# Patient Record
Sex: Female | Born: 1940 | Race: White | Hispanic: No | Marital: Married | Smoking: Never smoker
Health system: Southern US, Community
[De-identification: ages and names within clinical notes are randomized; demographics above are authoritative.]

## PROBLEM LIST (undated history)

## (undated) DIAGNOSIS — F32A Depression, unspecified: Secondary | ICD-10-CM

## (undated) DIAGNOSIS — N390 Urinary tract infection, site not specified: Secondary | ICD-10-CM

## (undated) DIAGNOSIS — E039 Hypothyroidism, unspecified: Secondary | ICD-10-CM

## (undated) DIAGNOSIS — F329 Major depressive disorder, single episode, unspecified: Secondary | ICD-10-CM

## (undated) DIAGNOSIS — R51 Headache: Secondary | ICD-10-CM

## (undated) DIAGNOSIS — R110 Nausea: Secondary | ICD-10-CM

## (undated) DIAGNOSIS — R42 Dizziness and giddiness: Secondary | ICD-10-CM

## (undated) DIAGNOSIS — K635 Polyp of colon: Secondary | ICD-10-CM

## (undated) DIAGNOSIS — M797 Fibromyalgia: Secondary | ICD-10-CM

## (undated) DIAGNOSIS — G8929 Other chronic pain: Secondary | ICD-10-CM

## (undated) DIAGNOSIS — R5383 Other fatigue: Secondary | ICD-10-CM

## (undated) DIAGNOSIS — K589 Irritable bowel syndrome without diarrhea: Secondary | ICD-10-CM

## (undated) DIAGNOSIS — E785 Hyperlipidemia, unspecified: Secondary | ICD-10-CM

## (undated) DIAGNOSIS — R55 Syncope and collapse: Secondary | ICD-10-CM

## (undated) HISTORY — DX: Hypothyroidism, unspecified: E03.9

## (undated) HISTORY — DX: Fibromyalgia: M79.7

## (undated) HISTORY — DX: Depression, unspecified: F32.A

## (undated) HISTORY — DX: Dizziness and giddiness: R42

## (undated) HISTORY — DX: Headache: R51

## (undated) HISTORY — DX: Hyperlipidemia, unspecified: E78.5

## (undated) HISTORY — DX: Urinary tract infection, site not specified: N39.0

## (undated) HISTORY — DX: Other chronic pain: G89.29

## (undated) HISTORY — DX: Other fatigue: R53.83

## (undated) HISTORY — DX: Irritable bowel syndrome without diarrhea: K58.9

## (undated) HISTORY — DX: Major depressive disorder, single episode, unspecified: F32.9

## (undated) HISTORY — DX: Nausea: R11.0

## (undated) HISTORY — DX: Polyp of colon: K63.5

## (undated) HISTORY — PX: OTHER SURGICAL HISTORY: SHX169

## (undated) HISTORY — DX: Syncope and collapse: R55

---
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
## 1992-11-28 HISTORY — PX: ABDOMINAL HYSTERECTOMY: SHX81

## 1998-11-28 HISTORY — PX: EXPLORATORY LAPAROTOMY: SUR591

## 2002-06-19 HISTORY — PX: COLONOSCOPY: SHX174

## 2010-05-25 ENCOUNTER — Ambulatory Visit: Payer: Self-pay | Admitting: Cardiovascular Disease

## 2011-01-20 ENCOUNTER — Encounter: Payer: Self-pay | Admitting: Cardiovascular Disease

## 2011-01-20 DIAGNOSIS — R110 Nausea: Secondary | ICD-10-CM | POA: Insufficient documentation

## 2011-01-20 DIAGNOSIS — R42 Dizziness and giddiness: Secondary | ICD-10-CM | POA: Insufficient documentation

## 2011-01-20 DIAGNOSIS — R55 Syncope and collapse: Secondary | ICD-10-CM | POA: Insufficient documentation

## 2011-01-20 DIAGNOSIS — E039 Hypothyroidism, unspecified: Secondary | ICD-10-CM | POA: Insufficient documentation

## 2011-01-20 DIAGNOSIS — G8929 Other chronic pain: Secondary | ICD-10-CM | POA: Insufficient documentation

## 2011-04-12 NOTE — Assessment & Plan Note (Signed)
[facility]                        [facility] OFFICE NOTE   NAME:Kehm, Jonella                       MRN:          [id number]  DATE:05/25/2010                            DOB:          10-29-1941    CHIEF COMPLAINT:  Dizziness.   HISTORY OF PRESENT ILLNESS:  Ms. Sheliah Hatch is a 70 year old female with  past medical history significant for hypothyroidism who is presenting  with a recent daylong experience of intermittent dizziness.  She saw Dr.  Manson Passey in Urgent Care and was subsequently diagnosed with urinary tract  infection and started on ciprofloxacin.  Since that time, the patient  states her symptoms of dizziness have completely resolved.  Of note, she  also experienced some nausea initially that is also resolved.  The  patient does endorse approximately 1-2 episodes a year of dizziness that  sometimes occur after standing oftentimes at night.  These episodes  occur much less frequently than they used to occur.  Review of the  urgent care note indicates that she was not orthostatic by pulse or  blood pressure at that time.   PAST MEDICAL HISTORY:  As above in HPI.   SOCIAL HISTORY:  No tobacco, no alcohol.  No drug use.  She lives with  her husband.   FAMILY HISTORY:  Negative for premature coronary artery disease.   ALLERGIES:  No known drug allergies.   MEDICATIONS:  Levothyroxine and Estratest.   REVIEW OF SYSTEMS:  As in HPI.  The patient states she does not perform  any regular physical activity or exercise.  The patient also endorses  occasional tingling and tingling comes and goes in the evenings in her  right lower extremity.  All other systems are reviewed and are negative.   PHYSICAL EXAMINATION:  VITAL SIGNS:  Blood pressure was 118/72, pulse  72, satting 97% on room air.  She weighs 118 pounds.  GENERAL:  No acute distress.  HEENT:  Normocephalic, atraumatic.  NECK:  Supple.  No JVD.  No carotid bruits.  HEART:   Regular rate and rhythm without murmur, rub or gallop.  LUNGS:  Clear bilaterally.  ABDOMEN:  Soft, nontender, nondistended.  EXTREMITIES:  Without edema.  SKIN:  Warm and dry.  NEURO:  Nonfocal.  MUSCULOSKELETAL:  5/5 in bilateral upper and lower extremity strength.  PSYCHIATRIC:  The patient is appropriate with normal levels of insight.   EKG taken today in clinic independently reviewed by myself demonstrates  normal sinus rhythm of left anterior fascicular block, nonspecific T-  wave abnormalities, and possible left atrial enlargement.  Review of the  patient's labs dated May 21, 2010, CMP was completely within normal  limits including a potassium of 4.4 and a creatinine of 0.84.  Her white  count was 6, her hemoglobin was 14, hematocrit 42, and platelet count  was 205.   ASSESSMENT:  A 70 year old female who likely experienced dizziness and  nausea secondary to her urinary tract infection.  These symptoms have  now completely resolved with treatment of the urinary tract infection.  Of note, she has also had a long-term history of infrequent  intermittent  episodes of dizziness and occasional syncope.  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  They are not currently  occurring frequently enough to be a bothersome to her.  She is reminded  to keep well hydrated and if these symptoms occur more frequently that  increased salt intake may also be helpful.  If these measures do not  prove helpful, she will be contacting to our office and we could  consider possible medical intervention.  It is also suggested that she  could begin on aspirin 81 mg a day for cardiovascular, specifically a  stroke prevention.  She is also encouraged to begin some sort of  organized physical activity at least 30 minutes a day.  She will follow  up with her primary care physician Dr. Sherral Hammers and we can see her back  in 1 year's time.     Brayton El, MD     SGA/MedQ  DD:  05/25/2010  DT: 05/26/2010  Job #: [id number]   cc:   Oretha Milch, MD at [facility]

## 2011-04-12 NOTE — Assessment & Plan Note (Signed)
[facility]                        [facility] OFFICE NOTE   NAME:Amey, Tyeesha                       MRN:          [id number]  DATE:05/25/2010                            DOB:          Apr 28, 1941    CHIEF COMPLAINT:  Dizziness.   HISTORY OF PRESENT ILLNESS:  Ms. Sheliah Hatch is a 70 year old female with  past medical history significant for hypothyroidism who is presenting  with a recent daylong experience of intermittent dizziness.  She saw Dr.  Manson Passey in Urgent Care and was subsequently diagnosed with urinary tract  infection and started on ciprofloxacin.  Since that time, the patient  states her symptoms of dizziness have completely resolved.  Of note, she  also experienced some nausea initially that is also resolved.  The  patient does endorse approximately 1-2 episodes a year of dizziness that  sometimes occur after standing oftentimes at night.  These episodes  occur much less frequently than they used to occur.  Review of the  urgent care note indicates that she was not orthostatic by pulse or  blood pressure at that time.   PAST MEDICAL HISTORY:  As above in HPI.   SOCIAL HISTORY:  No tobacco, no alcohol.  No drug use.  She lives with  her husband.   FAMILY HISTORY:  Negative for premature coronary artery disease.   ALLERGIES:  No known drug allergies.   MEDICATIONS:  Levothyroxine and Estratest.   REVIEW OF SYSTEMS:  As in HPI.  The patient states she does not perform  any regular physical activity or exercise.  The patient also endorses  occasional tingling and tingling comes and goes in the evenings in her  right lower extremity.  All other systems are reviewed and are negative.   PHYSICAL EXAMINATION:  VITAL SIGNS:  Blood pressure was 118/72, pulse  72, satting 97% on room air.  She weighs 118 pounds.  GENERAL:  No acute distress.  HEENT:  Normocephalic, atraumatic.  NECK:  Supple.  No JVD.  No carotid bruits.  HEART:   Regular rate and rhythm without murmur, rub or gallop.  LUNGS:  Clear bilaterally.  ABDOMEN:  Soft, nontender, nondistended.  EXTREMITIES:  Without edema.  SKIN:  Warm and dry.  NEURO:  Nonfocal.  MUSCULOSKELETAL:  5/5 in bilateral upper and lower extremity strength.  PSYCHIATRIC:  The patient is appropriate with normal levels of insight.   EKG taken today in clinic independently reviewed by myself demonstrates  normal sinus rhythm of left anterior fascicular block, nonspecific T-  wave abnormalities, and possible left atrial enlargement.  Review of the  patient's labs dated May 21, 2010, CMP was completely within normal  limits including a potassium of 4.4 and a creatinine of 0.84.  Her white  count was 6, her hemoglobin was 14, hematocrit 42, and platelet count  was 205.   ASSESSMENT:  A 70 year old female who likely experienced dizziness and  nausea secondary to her urinary tract infection.  These symptoms have  now completely resolved with treatment of the urinary tract infection.  Of note, she has also had a long-term history of infrequent  intermittent  episodes of dizziness and occasional syncope.  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  They are not currently occurring  frequently enough to be bothersome to her.  She is reminded to keep well  hydrated and if these symptoms occur more frequently that increased salt  intake may also be helpful.  If these measures do not prove helpful, she  will be contacting to our office and we could consider possible medical  intervention.  It is also suggested that she could begin on aspirin 81  mg a day for cardiovascular, specifically a stroke prevention.  She is  also encouraged to begin some sort of organized physical activity at  least 30 minutes a day.  She will follow up with her primary care  physician Dr. Sherral Hammers and we can see her back in 1 year's time.     Brayton El, MD  Electronically Signed     SGA/MedQ  DD: 05/25/2010  DT: 05/26/2010  Job #: [id number]   cc:   Oretha Milch, MD at [facility]

## 2011-05-05 ENCOUNTER — Ambulatory Visit: Payer: Self-pay | Admitting: Cardiovascular Disease

## 2011-05-11 ENCOUNTER — Encounter: Payer: Self-pay | Admitting: Cardiovascular Disease

## 2012-07-26 ENCOUNTER — Encounter: Payer: Self-pay | Admitting: *Deleted

## 2012-11-28 HISTORY — PX: COLONOSCOPY: SHX174

## 2015-09-07 DIAGNOSIS — Z23 Encounter for immunization: Secondary | ICD-10-CM | POA: Diagnosis not present

## 2015-09-07 DIAGNOSIS — R69 Illness, unspecified: Secondary | ICD-10-CM | POA: Diagnosis not present

## 2016-01-26 DIAGNOSIS — M1990 Unspecified osteoarthritis, unspecified site: Secondary | ICD-10-CM | POA: Diagnosis not present

## 2016-01-26 DIAGNOSIS — R1310 Dysphagia, unspecified: Secondary | ICD-10-CM | POA: Diagnosis not present

## 2016-02-19 DIAGNOSIS — R1314 Dysphagia, pharyngoesophageal phase: Secondary | ICD-10-CM | POA: Diagnosis not present

## 2016-02-19 DIAGNOSIS — R1013 Epigastric pain: Secondary | ICD-10-CM | POA: Diagnosis not present

## 2016-02-19 DIAGNOSIS — K210 Gastro-esophageal reflux disease with esophagitis: Secondary | ICD-10-CM | POA: Diagnosis not present

## 2016-02-23 DIAGNOSIS — K449 Diaphragmatic hernia without obstruction or gangrene: Secondary | ICD-10-CM | POA: Diagnosis not present

## 2016-02-23 DIAGNOSIS — K219 Gastro-esophageal reflux disease without esophagitis: Secondary | ICD-10-CM | POA: Diagnosis not present

## 2016-02-23 DIAGNOSIS — R1310 Dysphagia, unspecified: Secondary | ICD-10-CM | POA: Diagnosis not present

## 2016-03-09 DIAGNOSIS — Q393 Congenital stenosis and stricture of esophagus: Secondary | ICD-10-CM | POA: Diagnosis not present

## 2016-03-09 DIAGNOSIS — I313 Pericardial effusion (noninflammatory): Secondary | ICD-10-CM | POA: Diagnosis not present

## 2016-03-09 DIAGNOSIS — K222 Esophageal obstruction: Secondary | ICD-10-CM | POA: Diagnosis not present

## 2016-03-09 DIAGNOSIS — R1314 Dysphagia, pharyngoesophageal phase: Secondary | ICD-10-CM | POA: Diagnosis not present

## 2016-03-09 DIAGNOSIS — I129 Hypertensive chronic kidney disease with stage 1 through stage 4 chronic kidney disease, or unspecified chronic kidney disease: Secondary | ICD-10-CM | POA: Diagnosis not present

## 2016-03-09 DIAGNOSIS — K449 Diaphragmatic hernia without obstruction or gangrene: Secondary | ICD-10-CM | POA: Diagnosis not present

## 2016-03-09 DIAGNOSIS — K210 Gastro-esophageal reflux disease with esophagitis: Secondary | ICD-10-CM | POA: Diagnosis not present

## 2016-03-09 DIAGNOSIS — N181 Chronic kidney disease, stage 1: Secondary | ICD-10-CM | POA: Diagnosis not present

## 2016-03-09 DIAGNOSIS — E039 Hypothyroidism, unspecified: Secondary | ICD-10-CM | POA: Diagnosis not present

## 2016-03-09 DIAGNOSIS — K2970 Gastritis, unspecified, without bleeding: Secondary | ICD-10-CM | POA: Diagnosis not present

## 2016-03-09 DIAGNOSIS — R1013 Epigastric pain: Secondary | ICD-10-CM | POA: Diagnosis not present

## 2016-03-09 DIAGNOSIS — M810 Age-related osteoporosis without current pathological fracture: Secondary | ICD-10-CM | POA: Diagnosis not present

## 2016-03-09 DIAGNOSIS — K589 Irritable bowel syndrome without diarrhea: Secondary | ICD-10-CM | POA: Diagnosis not present

## 2016-03-09 HISTORY — PX: ESOPHAGOGASTRODUODENOSCOPY: SHX1529

## 2016-04-26 DIAGNOSIS — Z1231 Encounter for screening mammogram for malignant neoplasm of breast: Secondary | ICD-10-CM | POA: Diagnosis not present

## 2016-06-06 DIAGNOSIS — R69 Illness, unspecified: Secondary | ICD-10-CM | POA: Diagnosis not present

## 2016-06-06 DIAGNOSIS — I10 Essential (primary) hypertension: Secondary | ICD-10-CM | POA: Diagnosis not present

## 2016-06-06 DIAGNOSIS — Z79899 Other long term (current) drug therapy: Secondary | ICD-10-CM | POA: Diagnosis not present

## 2016-06-06 DIAGNOSIS — E782 Mixed hyperlipidemia: Secondary | ICD-10-CM | POA: Diagnosis not present

## 2016-06-06 DIAGNOSIS — E039 Hypothyroidism, unspecified: Secondary | ICD-10-CM | POA: Diagnosis not present

## 2016-06-06 DIAGNOSIS — Z1389 Encounter for screening for other disorder: Secondary | ICD-10-CM | POA: Diagnosis not present

## 2016-06-06 DIAGNOSIS — M810 Age-related osteoporosis without current pathological fracture: Secondary | ICD-10-CM | POA: Diagnosis not present

## 2016-06-06 DIAGNOSIS — M797 Fibromyalgia: Secondary | ICD-10-CM | POA: Diagnosis not present

## 2016-06-06 DIAGNOSIS — G4700 Insomnia, unspecified: Secondary | ICD-10-CM | POA: Diagnosis not present

## 2016-06-06 DIAGNOSIS — N181 Chronic kidney disease, stage 1: Secondary | ICD-10-CM | POA: Diagnosis not present

## 2016-07-26 DIAGNOSIS — H9192 Unspecified hearing loss, left ear: Secondary | ICD-10-CM | POA: Diagnosis not present

## 2016-07-28 DIAGNOSIS — H524 Presbyopia: Secondary | ICD-10-CM | POA: Diagnosis not present

## 2016-07-28 DIAGNOSIS — H269 Unspecified cataract: Secondary | ICD-10-CM | POA: Diagnosis not present

## 2016-07-28 DIAGNOSIS — Z0100 Encounter for examination of eyes and vision without abnormal findings: Secondary | ICD-10-CM | POA: Diagnosis not present

## 2016-08-10 DIAGNOSIS — H9012 Conductive hearing loss, unilateral, left ear, with unrestricted hearing on the contralateral side: Secondary | ICD-10-CM | POA: Diagnosis not present

## 2016-08-10 DIAGNOSIS — J3489 Other specified disorders of nose and nasal sinuses: Secondary | ICD-10-CM | POA: Diagnosis not present

## 2016-08-10 DIAGNOSIS — H6982 Other specified disorders of Eustachian tube, left ear: Secondary | ICD-10-CM | POA: Diagnosis not present

## 2016-08-10 DIAGNOSIS — H6522 Chronic serous otitis media, left ear: Secondary | ICD-10-CM | POA: Diagnosis not present

## 2016-08-31 DIAGNOSIS — Z23 Encounter for immunization: Secondary | ICD-10-CM | POA: Diagnosis not present

## 2016-09-01 DIAGNOSIS — H35373 Puckering of macula, bilateral: Secondary | ICD-10-CM | POA: Diagnosis not present

## 2016-09-14 DIAGNOSIS — E039 Hypothyroidism, unspecified: Secondary | ICD-10-CM | POA: Diagnosis not present

## 2016-10-12 DIAGNOSIS — Z0000 Encounter for general adult medical examination without abnormal findings: Secondary | ICD-10-CM | POA: Diagnosis not present

## 2016-10-12 DIAGNOSIS — Z9181 History of falling: Secondary | ICD-10-CM | POA: Diagnosis not present

## 2016-10-12 DIAGNOSIS — N760 Acute vaginitis: Secondary | ICD-10-CM | POA: Diagnosis not present

## 2016-10-12 DIAGNOSIS — B9689 Other specified bacterial agents as the cause of diseases classified elsewhere: Secondary | ICD-10-CM | POA: Diagnosis not present

## 2017-01-05 DIAGNOSIS — H9072 Mixed conductive and sensorineural hearing loss, unilateral, left ear, with unrestricted hearing on the contralateral side: Secondary | ICD-10-CM | POA: Diagnosis not present

## 2017-01-05 DIAGNOSIS — H6522 Chronic serous otitis media, left ear: Secondary | ICD-10-CM | POA: Diagnosis not present

## 2017-01-09 ENCOUNTER — Other Ambulatory Visit: Payer: Self-pay | Admitting: Otolaryngology

## 2017-01-09 DIAGNOSIS — H6522 Chronic serous otitis media, left ear: Secondary | ICD-10-CM

## 2017-01-12 ENCOUNTER — Inpatient Hospital Stay: Admission: RE | Admit: 2017-01-12 | Payer: Self-pay | Source: Ambulatory Visit

## 2017-01-17 ENCOUNTER — Ambulatory Visit
Admission: RE | Admit: 2017-01-17 | Discharge: 2017-01-17 | Disposition: A | Discharge status: Home / Self Care | Payer: Medicare HMO | Source: Ambulatory Visit | Attending: Otolaryngology | Admitting: Otolaryngology

## 2017-01-17 ENCOUNTER — Other Ambulatory Visit: Payer: Self-pay | Admitting: Otolaryngology

## 2017-01-17 DIAGNOSIS — H6522 Chronic serous otitis media, left ear: Secondary | ICD-10-CM | POA: Diagnosis not present

## 2017-01-17 MED ORDER — IOPAMIDOL (ISOVUE-300) INJECTION 61%
75.0000 mL | Freq: Once | INTRAVENOUS | Status: AC | PRN
  Administered 2017-01-17: 75 mL via INTRAVENOUS

## 2017-02-21 DIAGNOSIS — K5900 Constipation, unspecified: Secondary | ICD-10-CM | POA: Diagnosis not present

## 2017-02-21 DIAGNOSIS — Z681 Body mass index (BMI) 19 or less, adult: Secondary | ICD-10-CM | POA: Diagnosis not present

## 2017-03-31 DIAGNOSIS — H8111 Benign paroxysmal vertigo, right ear: Secondary | ICD-10-CM | POA: Diagnosis not present

## 2017-03-31 DIAGNOSIS — H6982 Other specified disorders of Eustachian tube, left ear: Secondary | ICD-10-CM | POA: Diagnosis not present

## 2017-03-31 DIAGNOSIS — H6522 Chronic serous otitis media, left ear: Secondary | ICD-10-CM | POA: Diagnosis not present

## 2017-03-31 DIAGNOSIS — H9012 Conductive hearing loss, unilateral, left ear, with unrestricted hearing on the contralateral side: Secondary | ICD-10-CM | POA: Diagnosis not present

## 2017-03-31 DIAGNOSIS — H90A32 Mixed conductive and sensorineural hearing loss, unilateral, left ear with restricted hearing on the contralateral side: Secondary | ICD-10-CM | POA: Diagnosis not present

## 2017-05-25 DIAGNOSIS — Z1231 Encounter for screening mammogram for malignant neoplasm of breast: Secondary | ICD-10-CM | POA: Diagnosis not present

## 2017-06-12 DIAGNOSIS — R69 Illness, unspecified: Secondary | ICD-10-CM | POA: Diagnosis not present

## 2017-07-13 DIAGNOSIS — R69 Illness, unspecified: Secondary | ICD-10-CM | POA: Diagnosis not present

## 2017-07-13 DIAGNOSIS — K219 Gastro-esophageal reflux disease without esophagitis: Secondary | ICD-10-CM | POA: Diagnosis not present

## 2017-07-13 DIAGNOSIS — N951 Menopausal and female climacteric states: Secondary | ICD-10-CM | POA: Diagnosis not present

## 2017-07-13 DIAGNOSIS — E039 Hypothyroidism, unspecified: Secondary | ICD-10-CM | POA: Diagnosis not present

## 2017-07-13 DIAGNOSIS — E782 Mixed hyperlipidemia: Secondary | ICD-10-CM | POA: Diagnosis not present

## 2017-07-13 DIAGNOSIS — E1165 Type 2 diabetes mellitus with hyperglycemia: Secondary | ICD-10-CM | POA: Diagnosis not present

## 2017-07-13 DIAGNOSIS — E559 Vitamin D deficiency, unspecified: Secondary | ICD-10-CM | POA: Diagnosis not present

## 2017-08-23 DIAGNOSIS — R69 Illness, unspecified: Secondary | ICD-10-CM | POA: Diagnosis not present

## 2017-09-07 DIAGNOSIS — E119 Type 2 diabetes mellitus without complications: Secondary | ICD-10-CM | POA: Diagnosis not present

## 2017-09-07 DIAGNOSIS — H35373 Puckering of macula, bilateral: Secondary | ICD-10-CM | POA: Diagnosis not present

## 2017-09-07 DIAGNOSIS — H25813 Combined forms of age-related cataract, bilateral: Secondary | ICD-10-CM | POA: Diagnosis not present

## 2017-10-02 DIAGNOSIS — M545 Low back pain: Secondary | ICD-10-CM | POA: Diagnosis not present

## 2017-10-02 DIAGNOSIS — B9689 Other specified bacterial agents as the cause of diseases classified elsewhere: Secondary | ICD-10-CM | POA: Diagnosis not present

## 2017-10-02 DIAGNOSIS — R300 Dysuria: Secondary | ICD-10-CM | POA: Diagnosis not present

## 2017-10-02 DIAGNOSIS — N760 Acute vaginitis: Secondary | ICD-10-CM | POA: Diagnosis not present

## 2017-10-02 DIAGNOSIS — R69 Illness, unspecified: Secondary | ICD-10-CM | POA: Diagnosis not present

## 2017-10-30 DIAGNOSIS — N898 Other specified noninflammatory disorders of vagina: Secondary | ICD-10-CM | POA: Diagnosis not present

## 2017-10-30 DIAGNOSIS — R300 Dysuria: Secondary | ICD-10-CM | POA: Diagnosis not present

## 2017-10-30 DIAGNOSIS — B373 Candidiasis of vulva and vagina: Secondary | ICD-10-CM | POA: Diagnosis not present

## 2017-10-30 DIAGNOSIS — R102 Pelvic and perineal pain: Secondary | ICD-10-CM | POA: Diagnosis not present

## 2017-10-30 DIAGNOSIS — K5900 Constipation, unspecified: Secondary | ICD-10-CM | POA: Diagnosis not present

## 2017-11-09 DIAGNOSIS — B372 Candidiasis of skin and nail: Secondary | ICD-10-CM | POA: Diagnosis not present

## 2017-11-09 DIAGNOSIS — Z23 Encounter for immunization: Secondary | ICD-10-CM | POA: Diagnosis not present

## 2017-11-09 DIAGNOSIS — M545 Low back pain: Secondary | ICD-10-CM | POA: Diagnosis not present

## 2017-11-09 DIAGNOSIS — K5900 Constipation, unspecified: Secondary | ICD-10-CM | POA: Diagnosis not present

## 2018-03-26 DIAGNOSIS — R69 Illness, unspecified: Secondary | ICD-10-CM | POA: Diagnosis not present

## 2018-04-19 DIAGNOSIS — K5909 Other constipation: Secondary | ICD-10-CM | POA: Diagnosis not present

## 2018-04-19 DIAGNOSIS — E1165 Type 2 diabetes mellitus with hyperglycemia: Secondary | ICD-10-CM | POA: Diagnosis not present

## 2018-04-19 DIAGNOSIS — Z23 Encounter for immunization: Secondary | ICD-10-CM | POA: Diagnosis not present

## 2018-04-19 DIAGNOSIS — N951 Menopausal and female climacteric states: Secondary | ICD-10-CM | POA: Diagnosis not present

## 2018-05-14 IMAGING — CT CT TEMPORAL BONES W/ CM
2 of 4 series · 16 of 30 positions shown, 19 images · IV contrast (iopamidol)
Comparison: None.

CLINICAL DATA: Chronic serous otitis media left ear

Creatinine was obtained on site at [HOSPITAL] at [HOSPITAL].Results: Creatinine 0.7 mg/dL.
EXAM:
CT TEMPORAL BONES WITH CONTRAST
TECHNIQUE: Axial and coronal plane CT imaging of the petrous temporal bones was
performed with thin-collimation image reconstruction after
intravenous contrast administration. Multiplanar CT image
reconstructions were also generated.
CONTRAST:  75mL [id number] IOPAMIDOL ([id number]) INJECTION 61%

[Series 6: rt mag axial · axial · 0.20mm/px · z∈[-124,-89]mm · 9 of 146 slices shown, 12 images]
[im 15/146  brain]
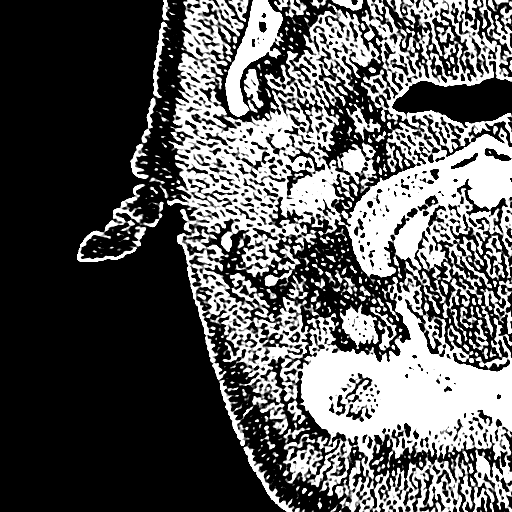
[im 15/146  bone]
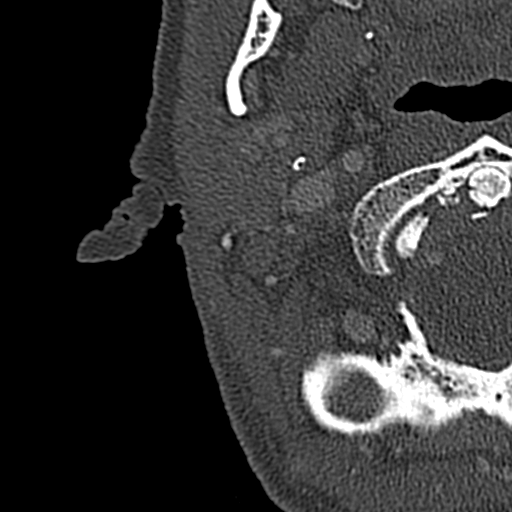
[im 30/146  bone]
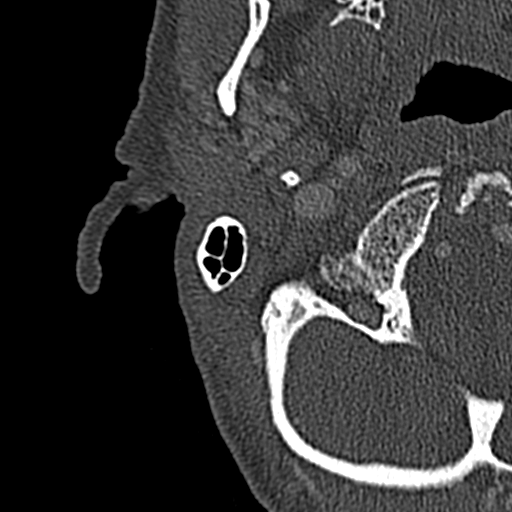
[im 44/146  bone]
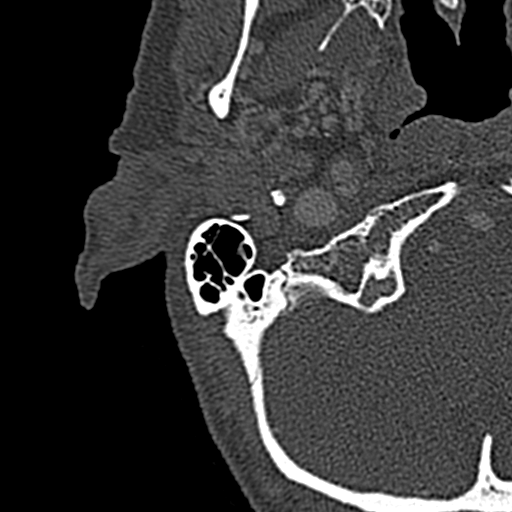
[im 59/146  bone]
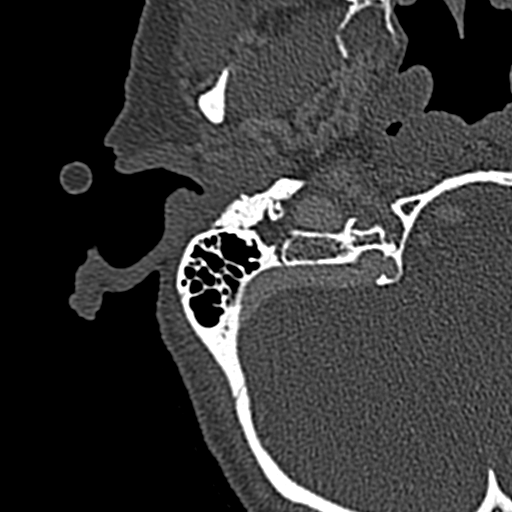
[im 73/146  brain]
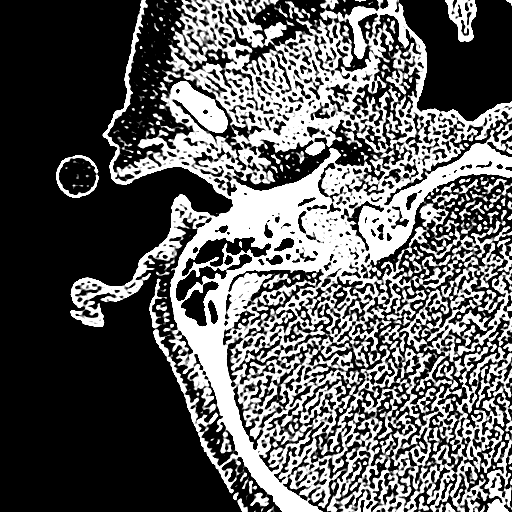
[im 73/146  bone]
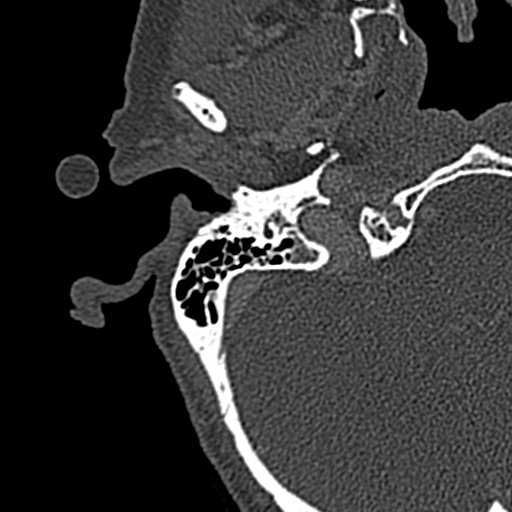
[im 88/146  bone]
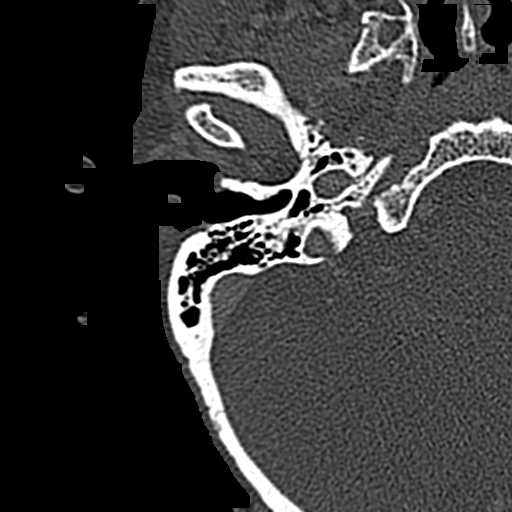
[im 102/146  bone]
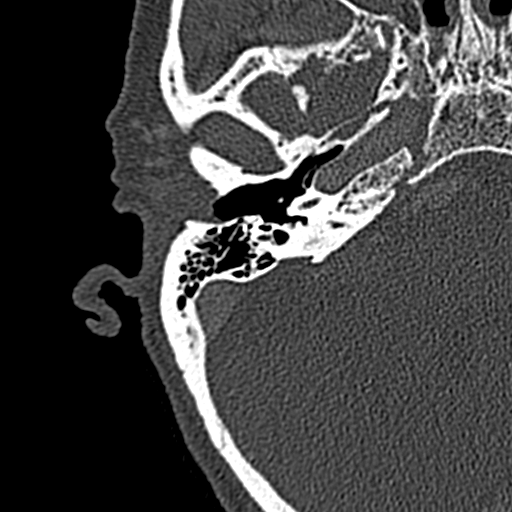
[im 117/146  bone]
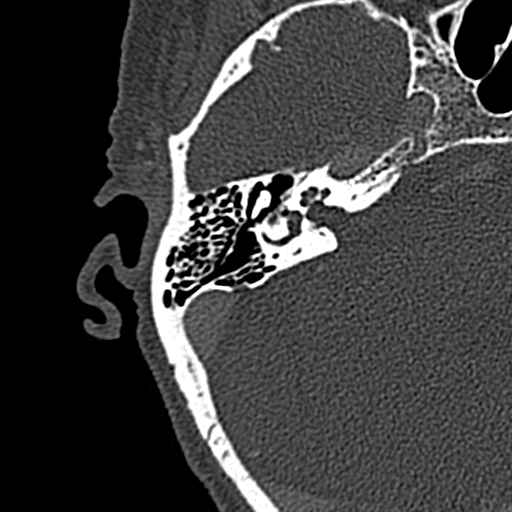
[im 131/146  brain]
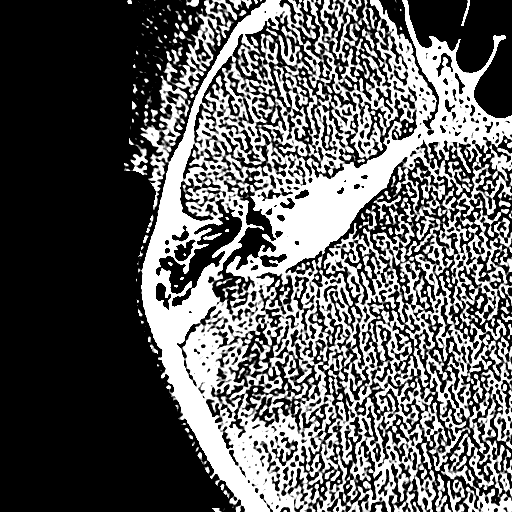
[im 131/146  bone]
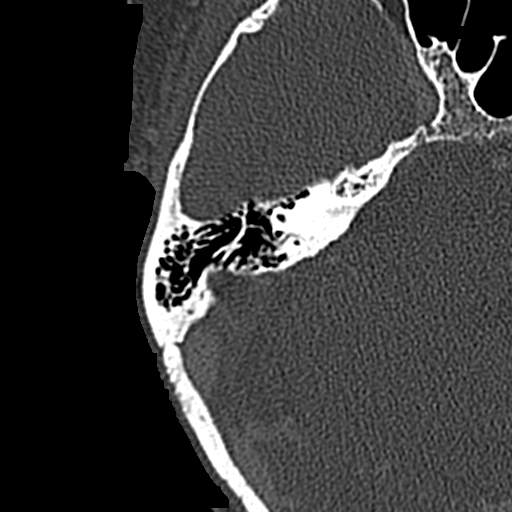

[Series 7: lt mag axial · axial · 0.20mm/px · z∈[-124,-93]mm · 7 of 146 slices shown]
[im 15/146  bone]
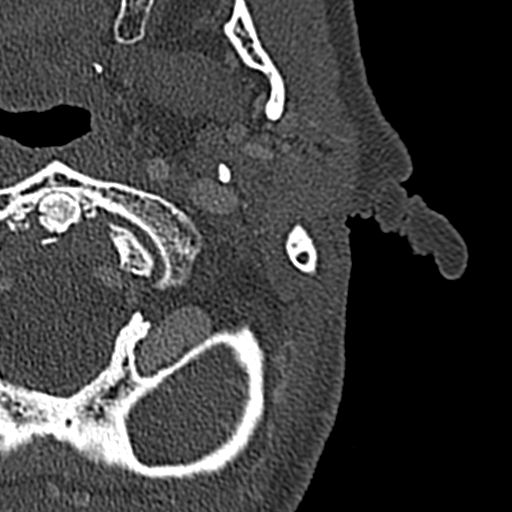
[im 30/146  bone]
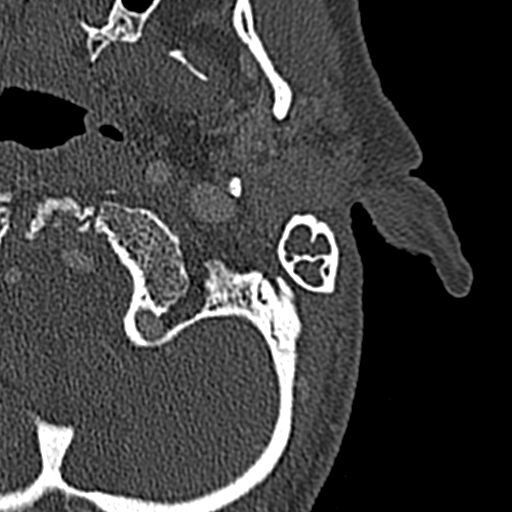
[im 44/146  bone]
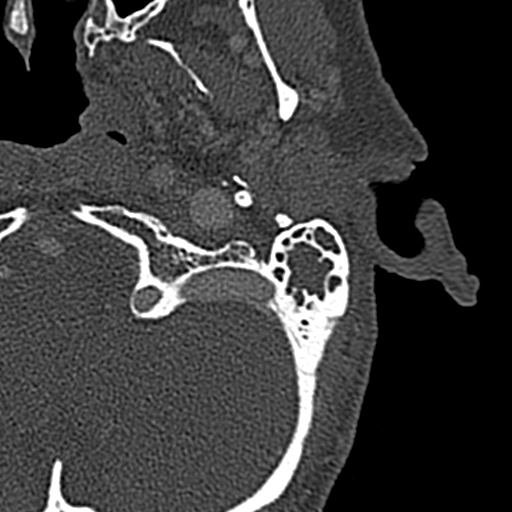
[im 59/146  bone]
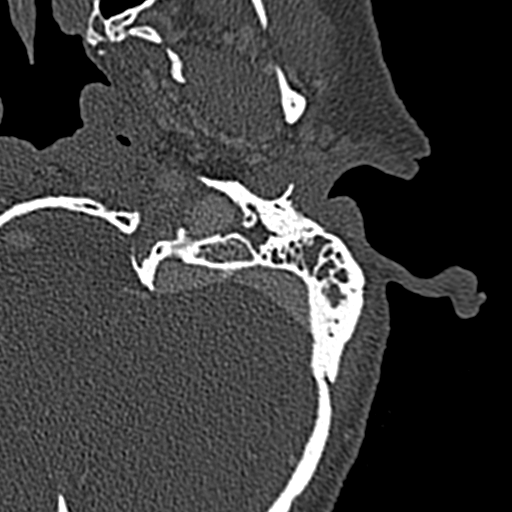
[im 88/146  bone]
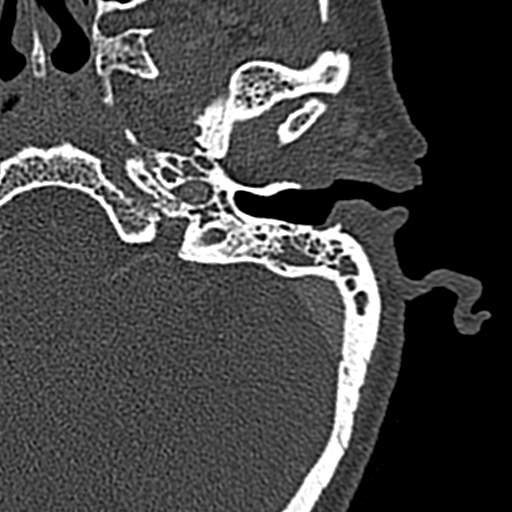
[im 102/146  bone]
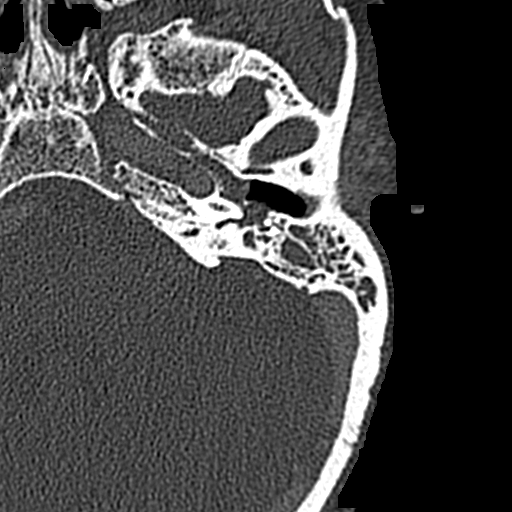
[im 117/146  bone]
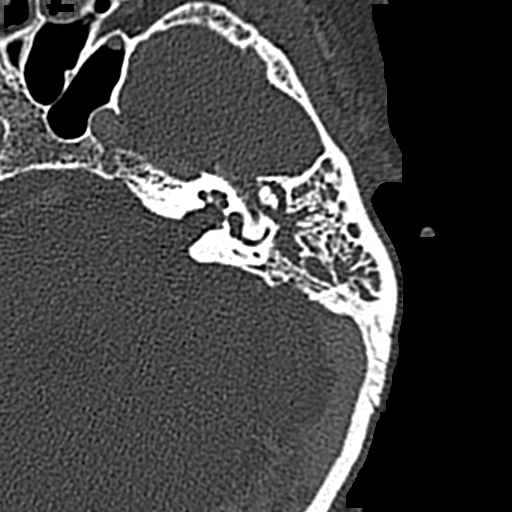

[16 of 30 positions shown; findings below may reference images not displayed]

FINDINGS: Chronic left facial fractures including the zygomatic arch and
maxilla with prior surgical repair of left maxilla fracture. Tripod
type fracture on the left. Paranasal sinuses clear. Central
skullbase intact.

Right temporal bone: Mastoid sinus well developed and clear. Middle
ear and ossicles clearing normal. Tympanic membrane normal. Internal
and external auditory canals normal. Cochlea and semi circular
canals normal. No enhancing mass lesion. Normal enhancement of the
transverse and sigmoid sinus on the right. Surrounding soft tissues
normal.

Left temporal bone: Left mastoid sinus effusion. Mastoid air cells
well developed and completely opacified by fluid. Left middle ear
effusion. Ossicles normal. External auditory canal normal. Internal
auditory canal normal. No enhancing mass lesion. Negative for
cholesteatoma. Cochlea and semi circular canals normal. No fracture
identified. Normal enhancement of the transverse sinus and sigmoid
sinus. Surrounding soft tissues normal.
IMPRESSION: Left middle ear and left mastoid effusion of uncertain etiology. No
mass or cholesteatoma identified. Given history of left facial
fractures, posttraumatic labyrinthine or CSF fistula are
possibilities. No fracture in the temporal bone identified

Normal right temporal bone

## 2018-06-13 ENCOUNTER — Ambulatory Visit (INDEPENDENT_AMBULATORY_CARE_PROVIDER_SITE_OTHER): Payer: Medicare HMO | Admitting: Sports Medicine

## 2018-06-13 ENCOUNTER — Encounter: Payer: Self-pay | Admitting: Sports Medicine

## 2018-06-13 VITALS — BP 124/66 | HR 58 | Temp 98.6°F | Resp 16 | Ht 61.0 in | Wt 100.0 lb

## 2018-06-13 DIAGNOSIS — M79672 Pain in left foot: Secondary | ICD-10-CM | POA: Diagnosis not present

## 2018-06-13 DIAGNOSIS — M79671 Pain in right foot: Secondary | ICD-10-CM | POA: Diagnosis not present

## 2018-06-13 DIAGNOSIS — Q828 Other specified congenital malformations of skin: Secondary | ICD-10-CM

## 2018-06-13 NOTE — Progress Notes (Signed)
   Subjective:    Patient ID: Elaine Hudson, female    DOB: 03/29/1941, 77 y.o.   MRN: [id number]  HPI    Review of Systems  All other systems reviewed and are negative.      Objective:   Physical Exam        Assessment & Plan:

## 2018-06-13 NOTE — Progress Notes (Signed)
Subjective: Elaine Hudson is a 77 y.o. female patient who presents to office for evaluation of Right> Left foot pain secondary to callus skin. Patient complains of pain at the lesion present Right>Left foot at the ball of both feet. Patient has tried self trimming and moisturizing creams with no relief in symptoms. Patient denies any other pedal complaints.   Review of Systems  All other systems reviewed and are negative.  Patient Active Problem List   Diagnosis Date Noted  . Hypothyroidism   . Dizziness   . Nausea   . Chronic headaches   . Fainting     Current Outpatient Medications on File Prior to Visit  Medication Sig Dispense Refill  . levothyroxine (SYNTHROID, LEVOTHROID) 50 MCG tablet Take 50 mcg by mouth daily before breakfast.    . Omeprazole 20 MG TBDD Take by mouth.    . zolpidem (AMBIEN) 10 MG tablet Take 10 mg by mouth at bedtime as needed for sleep.     No current facility-administered medications on file prior to visit.     No Known Allergies  Objective:  General: Alert and oriented x3 in no acute distress  Dermatology: Keratotic lesion present submit to bilateral with skin lines transversing the lesion, pain is present with direct pressure to the lesion with a central nucleated core noted, no webspace macerations, no ecchymosis bilateral, all nails x 10 are well manicured.  Vascular: Dorsalis Pedis and Posterior Tibial pedal pulses 2/4, Capillary Fill Time 3 seconds, + pedal hair growth bilateral, no edema bilateral lower extremities, Temperature gradient within normal limits.  Neurology: Johney Maine sensation intact via light touch bilateral.  Musculoskeletal: Mild tenderness with palpation at the keratotic lesion site on Right>Left, Muscular strength 5/5 in all groups without pain or limitation on range of motion. Fat pad atrophy noted bilateral.  Assessment and Plan: Problem List Items Addressed This Visit    None    Visit Diagnoses    Porokeratosis    -   Primary   Bilateral foot pain          -Complete examination performed -Discussed treatment options -Parred keratoic lesion using a chisel blade x2; treated the area withSalinocaine covered with Band-Aid -Encouraged daily skin emollients -Encouraged use of pumice stone -Advised good supportive shoes and inserts -Patient to return to office as needed or sooner if condition worsens.  Landis Martins, DPM

## 2018-06-14 DIAGNOSIS — Z780 Asymptomatic menopausal state: Secondary | ICD-10-CM | POA: Diagnosis not present

## 2018-06-14 DIAGNOSIS — Z1231 Encounter for screening mammogram for malignant neoplasm of breast: Secondary | ICD-10-CM | POA: Diagnosis not present

## 2018-06-14 DIAGNOSIS — M8589 Other specified disorders of bone density and structure, multiple sites: Secondary | ICD-10-CM | POA: Diagnosis not present

## 2018-08-09 DIAGNOSIS — E039 Hypothyroidism, unspecified: Secondary | ICD-10-CM | POA: Diagnosis not present

## 2018-08-09 DIAGNOSIS — K588 Other irritable bowel syndrome: Secondary | ICD-10-CM | POA: Diagnosis not present

## 2018-08-09 DIAGNOSIS — E782 Mixed hyperlipidemia: Secondary | ICD-10-CM | POA: Diagnosis not present

## 2018-08-09 DIAGNOSIS — R69 Illness, unspecified: Secondary | ICD-10-CM | POA: Diagnosis not present

## 2018-08-09 DIAGNOSIS — R140 Abdominal distension (gaseous): Secondary | ICD-10-CM | POA: Diagnosis not present

## 2018-08-09 DIAGNOSIS — R8290 Unspecified abnormal findings in urine: Secondary | ICD-10-CM | POA: Diagnosis not present

## 2018-08-09 DIAGNOSIS — K5909 Other constipation: Secondary | ICD-10-CM | POA: Diagnosis not present

## 2018-08-09 DIAGNOSIS — M8589 Other specified disorders of bone density and structure, multiple sites: Secondary | ICD-10-CM | POA: Diagnosis not present

## 2018-08-09 DIAGNOSIS — Z1211 Encounter for screening for malignant neoplasm of colon: Secondary | ICD-10-CM | POA: Diagnosis not present

## 2018-08-09 DIAGNOSIS — E1165 Type 2 diabetes mellitus with hyperglycemia: Secondary | ICD-10-CM | POA: Diagnosis not present

## 2018-08-10 DIAGNOSIS — R195 Other fecal abnormalities: Secondary | ICD-10-CM | POA: Diagnosis not present

## 2018-08-10 DIAGNOSIS — Z1211 Encounter for screening for malignant neoplasm of colon: Secondary | ICD-10-CM | POA: Diagnosis not present

## 2018-08-20 ENCOUNTER — Encounter: Payer: Self-pay | Admitting: Gastroenterology

## 2018-08-28 ENCOUNTER — Ambulatory Visit: Payer: Medicare HMO | Admitting: Gastroenterology

## 2018-08-28 DIAGNOSIS — D485 Neoplasm of uncertain behavior of skin: Secondary | ICD-10-CM | POA: Diagnosis not present

## 2018-08-28 DIAGNOSIS — L719 Rosacea, unspecified: Secondary | ICD-10-CM | POA: Diagnosis not present

## 2018-08-28 DIAGNOSIS — L219 Seborrheic dermatitis, unspecified: Secondary | ICD-10-CM | POA: Diagnosis not present

## 2018-08-31 ENCOUNTER — Encounter: Payer: Self-pay | Admitting: Gastroenterology

## 2018-09-03 ENCOUNTER — Ambulatory Visit (HOSPITAL_BASED_OUTPATIENT_CLINIC_OR_DEPARTMENT_OTHER)
Admission: RE | Admit: 2018-09-03 | Discharge: 2018-09-03 | Disposition: A | Discharge status: Home / Self Care | Payer: Medicare HMO | Source: Ambulatory Visit | Attending: Gastroenterology | Admitting: Gastroenterology

## 2018-09-03 ENCOUNTER — Ambulatory Visit: Payer: Medicare HMO | Admitting: Gastroenterology

## 2018-09-03 VITALS — BP 122/80 | HR 72 | Ht 62.0 in | Wt 101.5 lb

## 2018-09-03 DIAGNOSIS — R195 Other fecal abnormalities: Secondary | ICD-10-CM

## 2018-09-03 DIAGNOSIS — R159 Full incontinence of feces: Secondary | ICD-10-CM

## 2018-09-03 DIAGNOSIS — K5909 Other constipation: Secondary | ICD-10-CM | POA: Diagnosis not present

## 2018-09-03 NOTE — Patient Instructions (Addendum)
If you are age 75 or older, your body mass index should be between 23-30. Your Body mass index is 18.56 kg/m. If this is out of the aforementioned range listed, please consider follow up with your Primary Care Provider.  If you are age 19 or younger, your body mass index should be between 19-25. Your Body mass index is 18.56 kg/m. If this is out of the aformentioned range listed, please consider follow up with your Primary Care Provider.   You have been scheduled for a colonoscopy. Please follow written instructions given to you at your visit today.  Please pick up your prep supplies at the pharmacy within the next 1-3 days. If you use inhalers (even only as needed), please bring them with you on the day of your procedure. Your physician has requested that you go to www.startemmi.com and enter the access code given to you at your visit today. This web site gives a general overview about your procedure. However, you should still follow specific instructions given to you by our office regarding your preparation for the procedure.  Decrease your Miralax to 1/2 dose daily.   Kegel exercises: A how-to guide for women Kegel exercises can prevent or control urinary incontinence and other pelvic floor problems. Here's a step-by-step guide to doing Kegel exercises correctly.  Kegel exercises strengthen the pelvic floor muscles, which support the uterus, bladder, small intestine and rectum. You can do Kegel exercises, also known as pelvic floor muscle training, just about anytime. Start by understanding what Kegel exercises can do for you - then follow these instructions for contracting and relaxing your pelvic floor muscles. Why Kegel exercises matter The pelvic floor muscles work like a hammock to support the pelvic organs, including the uterus, bladder and rectum. Kegel exercises can help strengthen these muscles. "  The pelvic floor muscles work like a hammock to support the pelvic organs, including  the uterus, bladder and rectum. Kegel exercises can help strengthen these muscles. "  Female pelvic floor muscles  Many factors can weaken your pelvic floor muscles, including pregnancy, childbirth, surgery, aging, excessive straining from constipation or chronic coughing, and being overweight. You might benefit from doing Kegel exercises if you: Leak a few drops of urine while sneezing, laughing or coughing (stress incontinence)  Have a strong, sudden urge to urinate just before losing a large amount of urine (urinary urge incontinence)  Leak stool (fecal incontinence) Kegel exercises can also be done during pregnancy or after childbirth to try to improve your symptoms. Kegel exercises are less helpful for women who have severe urine leakage when they sneeze, cough or laugh. Also, Kegel exercises aren't helpful for women who unexpectedly leak small amounts of urine due to a full bladder (overflow incontinence). How to do Kegel exercises To get started: Find the right muscles. To identify your pelvic floor muscles, stop urination in midstream. Once you've identified your pelvic floor muscles you can do the exercises in any position, although you might find it easiest to do them lying down at first.  Perfect your technique. To do Kegels, imagine you are sitting on a marble and tighten your pelvic muscles as if you're lifting the marble. Try it for three seconds at a time, then relax for a count of three.  Maintain your focus. For best results, focus on tightening only your pelvic floor muscles. Be careful not to flex the muscles in your abdomen, thighs or buttocks. Avoid holding your breath. Instead, breathe freely during the exercises.  Repeat  three times a day. Aim for at least three sets of 10 to 15 repetitions a day. Don't make a habit of using Kegel exercises to start and stop your urine stream. Doing Kegel exercises while emptying your bladder can actually lead to incomplete emptying of the  bladder - which increases the risk of a urinary tract infection. When to do your Kegels Make Kegel exercises part of your daily routine. You can do Kegel exercises discreetly just about any time, whether you're sitting at your desk or relaxing on the couch. When you're having trouble If you're having trouble doing Kegel exercises, don't be embarrassed to ask for help. Your doctor or other health care provider can give you important feedback so that you learn to isolate and exercise the correct muscles. In some cases, vaginal weighted cones or biofeedback might help. To use a vaginal cone, you insert it into your vagina and use pelvic muscle contractions to hold it in place during your daily activities. During a biofeedback session, your doctor or other health care provider inserts a pressure sensor into your vagina or rectum. As you relax and contract your pelvic floor muscles, a monitor will measure and display your pelvic floor activity. When to expect results If you do Kegel exercises regularly, you can expect results - such as less frequent urine leakage - within about a few weeks to a few months. For continued benefits, make Kegel exercises a permanent part of your daily routine.  Your provider has requested that you have an abdominal x ray before leaving today. Please go to the 1st floor to our Radiology department for the test.   Thank you,  Dr. Jackquline Denmark

## 2018-09-03 NOTE — Progress Notes (Signed)
Chief Complaint: FU  Referring Provider:  Myrlene Broker, MD      ASSESSMENT AND PLAN;   #1. Heme positive stools. Differential diagnosis includes hemorrhoids, AVMs, colitis, polyps, rule out colonic neoplasms or IBD. #2. 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 H/O Polyps #4. Chronic constipation (X-ray KUB -significant retained stool in the colon, treated with MiraLAX)  Plan: - X ray KUB AAS. - Check CBC. - Decreased MiraLAX to half the dose every day.  If still with problems and the above x-ray KUB does not show any significant stool, then would stop MiraLAX and start her on stool softeners. - Proceed with colonoscopy.  I have discussed the risks and benefits.  The risks including risk of perforation requiring laparotomy, bleeding after polypectomy requiring blood transfusions and risks of anesthesia/sedation were discussed.  Rare risks of missing colorectal neoplasms were also discussed.  Alternatives were given.  Patient is fully aware and agrees to proceed. All the questions were answered. Colonoscopy will be scheduled in upcoming days.  Patient is to report immediately if there is any significant weight loss or excessive bleeding until then. Consent forms were given for review. - Kegel's exercises - If continues to have problems, will consider sitz marker study possibly followed by anorectal manometry.  HPI:    Elaine Hudson is a 77 y.o. female  Referred by Dr. Unk Lightning For heme positive stools History of significant constipation, then took MiraLAX, then had diarrhea and rectal incontinence. Continues to be on MiraLAX but the dose has been reduced to once a day.  Continued rectal incontinence with rectal seepage. No melena or hematochezia. No weight loss (she has always been underweight) Accompanied by her family.  Last colon[facility] by Dr[facility] Harrell Lark at [facility] in 2015-advised to get colonoscopy repeated in 3 years.  Had polyps per  patient.  History of esophageal dysphagia status post EGD with biopsies and dilatation of Schatzki's ring 48Fr 10/2016, with resolution of dysphagia.  Has small transient hiatal hernia, CLOtest was negative, small bowel biopsies negative for celiac.   Past Medical History:  Diagnosis Date  . Chronic headaches   . Colon polyp   . Constipation   . Depression   . Diabetes mellitus   . Dizziness   . Fainting   . Fatigue   . Fibromyalgia   . Hyperlipidemia   . Hypothyroidism   . IBS (irritable bowel syndrome)   . Nausea    RESOLVED  . UTI (urinary tract infection)     Past Surgical History:  Procedure Laterality Date  . ABDOMINAL HYSTERECTOMY  1994  . COLONOSCOPY  06/19/2002   Incomplete colonoscopy, grossly normal up to 35 cm. Evidence of extrinsic adhesions, possibly related to previous laparotomy. Rectal erythema, questionable importance, most likely due to preparation.   . COLONOSCOPY  2014   Dr Collier Salina with [facility]   . ESOPHAGOGASTRODUODENOSCOPY  03/09/2016   Schatzkis ring status post esophageal dilatation. Minimal transient hiatal hernia. Mild gastritis.   . EXPLORATORY LAPAROTOMY  2000  . Femur Fx Repair    . TONSILLECTOMY  1958    Family History  Problem Relation Age of Onset  . Cancer Father 27       LUNG CANCER  . Colon polyps Sister   . Colon cancer Neg Hx   . Esophageal cancer Neg Hx     Social History   Tobacco Use  . Smoking status: Never Smoker  . Smokeless tobacco: Never Used  Substance Use Topics  .  Alcohol use: No  . Drug use: No    Current Outpatient Medications  Medication Sig Dispense Refill  . Ascorbic Acid (VITAMIN C) 100 MG tablet Take by mouth.    . calcium carbonate (CALCIUM 600) 600 MG TABS tablet Take 600 mg by mouth 2 (two) times daily with a meal.    . cholecalciferol (VITAMIN D) 400 units TABS tablet Take 400 Units by mouth daily.    Marland Kitchen FLUoxetine (PROZAC) 40 MG capsule Take 1 capsule by mouth daily.    Marland Kitchen levothyroxine  (SYNTHROID, LEVOTHROID) 50 MCG tablet Take 50 mcg by mouth daily before breakfast.    . meloxicam (MOBIC) 15 MG tablet Take 1 tablet by mouth daily.    . Omeprazole 20 MG TBDD Take 1 tablet by mouth daily.     Marland Kitchen zolpidem (AMBIEN) 10 MG tablet Take 10 mg by mouth at bedtime as needed for sleep.     No current facility-administered medications for this visit.     No Known Allergies  Review of Systems:  Negative except for HPI Has back pain, muscle cramps and pain.     Physical Exam:    BP 122/80   Pulse 72   Ht 5\' 2"  (1.575 m)   Wt 101 lb 8 oz (46 kg)   BMI 18.56 kg/m  Filed Weights   09/03/18 1401  Weight: 101 lb 8 oz (46 kg)   Constitutional:  Well-developed, in no acute distress. Psychiatric: Normal mood and affect. Behavior is normal. HEENT: Pupils normal.  Conjunctivae are normal. No scleral icterus. Neck supple.  Cardiovascular: Normal rate, regular rhythm. No edema Pulmonary/chest: Effort normal and breath sounds normal. No wheezing, rales or rhonchi. Abdominal: Soft, nondistended. Nontender. Bowel sounds active throughout. There are no masses palpable. No hepatomegaly. Rectal: Good rectal tone, brown heme-negative stools, ?  Small rectocele.  Neurological: Alert and oriented to person place and time. Skin: Skin is warm and dry. No rashes noted. Seen in presence of Brooke CMA  Data Reviewed: I have personally reviewed following labs and imaging studies CMP -normal with normal BUN/creatinine, GFR 69 mL/min.  Hemoglobin A1c 6.0, normal TSH 1.6   Carmell Austria, MD 09/03/2018, 2:26 PM  Cc: Myrlene Broker, MD

## 2018-09-06 DIAGNOSIS — Z23 Encounter for immunization: Secondary | ICD-10-CM | POA: Diagnosis not present

## 2018-09-07 ENCOUNTER — Telehealth: Payer: Self-pay | Admitting: Gastroenterology

## 2018-09-07 ENCOUNTER — Telehealth: Payer: Self-pay

## 2018-09-07 DIAGNOSIS — R195 Other fecal abnormalities: Secondary | ICD-10-CM

## 2018-09-07 NOTE — Telephone Encounter (Signed)
Pt calling inquiring about results of imaging that she had 4 days ago.

## 2018-09-07 NOTE — Telephone Encounter (Signed)
Pt calling for xray result she had done several days ago. Please advise.

## 2018-09-07 NOTE — Telephone Encounter (Signed)
Have cbc drawn per Dr. Lyndel Safe. I have called and set up an appointment with the patient to have this drawn.

## 2018-09-09 NOTE — Telephone Encounter (Signed)
See note 10/11

## 2018-09-10 ENCOUNTER — Other Ambulatory Visit (INDEPENDENT_AMBULATORY_CARE_PROVIDER_SITE_OTHER): Payer: Medicare HMO

## 2018-09-10 DIAGNOSIS — R195 Other fecal abnormalities: Secondary | ICD-10-CM | POA: Diagnosis not present

## 2018-09-10 LAB — CBC WITH DIFFERENTIAL/PLATELET
BASOS PCT: 1.2 % (ref 0.0–3.0)
Basophils Absolute: 0 10*3/uL (ref 0.0–0.1)
EOS PCT: 4.5 % (ref 0.0–5.0)
Eosinophils Absolute: 0.2 10*3/uL (ref 0.0–0.7)
HEMATOCRIT: 38.1 % (ref 36.0–46.0)
HEMOGLOBIN: 12.6 g/dL (ref 12.0–15.0)
Lymphocytes Relative: 29.4 % (ref 12.0–46.0)
Lymphs Abs: 1.1 10*3/uL (ref 0.7–4.0)
MCHC: 33.1 g/dL (ref 30.0–36.0)
MCV: 94.4 fl (ref 78.0–100.0)
Monocytes Absolute: 0.4 10*3/uL (ref 0.1–1.0)
Monocytes Relative: 10.7 % (ref 3.0–12.0)
Neutro Abs: 2.1 10*3/uL (ref 1.4–7.7)
Neutrophils Relative %: 54.2 % (ref 43.0–77.0)
Platelets: 205 10*3/uL (ref 150.0–400.0)
RBC: 4.03 Mil/uL (ref 3.87–5.11)
RDW: 13.5 % (ref 11.5–15.5)
WBC: 3.8 10*3/uL — ABNORMAL LOW (ref 4.0–10.5)

## 2018-09-10 NOTE — Telephone Encounter (Signed)
Pt return call, she would like for you to call her back after 3:20 [phone]

## 2018-09-10 NOTE — Telephone Encounter (Signed)
Jackquline Denmark, MD  Algernon Huxley, RN        Please inform the patient.  X-ray KUB-negative except for some stool.  If still with problems with incontinence, stop MiraLAX and start Colace 1 tablet p.o. once a day. Otherwise, continue half of MiraLAX every day.  Send report to family physician    Left message for pt to call back.

## 2018-09-10 NOTE — Telephone Encounter (Signed)
Spoke with pt and she is aware, report sent to PCP.

## 2018-10-09 ENCOUNTER — Encounter: Payer: Self-pay | Admitting: Gastroenterology

## 2018-10-24 ENCOUNTER — Encounter: Payer: Medicare HMO | Admitting: Gastroenterology

## 2018-11-09 DIAGNOSIS — H35373 Puckering of macula, bilateral: Secondary | ICD-10-CM | POA: Diagnosis not present

## 2018-11-09 DIAGNOSIS — H25813 Combined forms of age-related cataract, bilateral: Secondary | ICD-10-CM | POA: Diagnosis not present

## 2019-03-19 DIAGNOSIS — R69 Illness, unspecified: Secondary | ICD-10-CM | POA: Diagnosis not present

## 2019-03-19 DIAGNOSIS — M4802 Spinal stenosis, cervical region: Secondary | ICD-10-CM | POA: Diagnosis not present

## 2019-03-19 DIAGNOSIS — G609 Hereditary and idiopathic neuropathy, unspecified: Secondary | ICD-10-CM | POA: Diagnosis not present

## 2019-03-19 DIAGNOSIS — E1165 Type 2 diabetes mellitus with hyperglycemia: Secondary | ICD-10-CM | POA: Diagnosis not present

## 2019-03-19 DIAGNOSIS — M5412 Radiculopathy, cervical region: Secondary | ICD-10-CM | POA: Diagnosis not present

## 2019-03-19 DIAGNOSIS — M4312 Spondylolisthesis, cervical region: Secondary | ICD-10-CM | POA: Diagnosis not present

## 2019-03-20 DIAGNOSIS — G609 Hereditary and idiopathic neuropathy, unspecified: Secondary | ICD-10-CM | POA: Diagnosis not present

## 2019-03-20 DIAGNOSIS — E1165 Type 2 diabetes mellitus with hyperglycemia: Secondary | ICD-10-CM | POA: Diagnosis not present

## 2019-06-24 DIAGNOSIS — Z1231 Encounter for screening mammogram for malignant neoplasm of breast: Secondary | ICD-10-CM | POA: Diagnosis not present

## 2019-08-19 DIAGNOSIS — L239 Allergic contact dermatitis, unspecified cause: Secondary | ICD-10-CM | POA: Diagnosis not present

## 2019-09-03 DIAGNOSIS — R69 Illness, unspecified: Secondary | ICD-10-CM | POA: Diagnosis not present

## 2019-10-29 DIAGNOSIS — Z0000 Encounter for general adult medical examination without abnormal findings: Secondary | ICD-10-CM | POA: Diagnosis not present

## 2019-10-29 DIAGNOSIS — R69 Illness, unspecified: Secondary | ICD-10-CM | POA: Diagnosis not present

## 2019-10-29 DIAGNOSIS — E1165 Type 2 diabetes mellitus with hyperglycemia: Secondary | ICD-10-CM | POA: Diagnosis not present

## 2019-10-29 DIAGNOSIS — E039 Hypothyroidism, unspecified: Secondary | ICD-10-CM | POA: Diagnosis not present

## 2019-10-29 DIAGNOSIS — K219 Gastro-esophageal reflux disease without esophagitis: Secondary | ICD-10-CM | POA: Diagnosis not present

## 2019-10-29 DIAGNOSIS — Z1211 Encounter for screening for malignant neoplasm of colon: Secondary | ICD-10-CM | POA: Diagnosis not present

## 2019-10-29 DIAGNOSIS — R5383 Other fatigue: Secondary | ICD-10-CM | POA: Diagnosis not present

## 2019-10-29 DIAGNOSIS — E782 Mixed hyperlipidemia: Secondary | ICD-10-CM | POA: Diagnosis not present

## 2019-10-29 DIAGNOSIS — Z23 Encounter for immunization: Secondary | ICD-10-CM | POA: Diagnosis not present

## 2019-10-29 DIAGNOSIS — R5381 Other malaise: Secondary | ICD-10-CM | POA: Diagnosis not present

## 2020-01-10 DIAGNOSIS — H35373 Puckering of macula, bilateral: Secondary | ICD-10-CM | POA: Diagnosis not present

## 2020-01-10 DIAGNOSIS — H25813 Combined forms of age-related cataract, bilateral: Secondary | ICD-10-CM | POA: Diagnosis not present

## 2020-01-30 DIAGNOSIS — Z01818 Encounter for other preprocedural examination: Secondary | ICD-10-CM | POA: Diagnosis not present

## 2020-01-30 DIAGNOSIS — H43823 Vitreomacular adhesion, bilateral: Secondary | ICD-10-CM | POA: Diagnosis not present

## 2020-01-30 DIAGNOSIS — H25812 Combined forms of age-related cataract, left eye: Secondary | ICD-10-CM | POA: Diagnosis not present

## 2020-01-30 DIAGNOSIS — H2512 Age-related nuclear cataract, left eye: Secondary | ICD-10-CM | POA: Diagnosis not present

## 2020-01-30 DIAGNOSIS — H25811 Combined forms of age-related cataract, right eye: Secondary | ICD-10-CM | POA: Diagnosis not present

## 2020-02-13 DIAGNOSIS — H25811 Combined forms of age-related cataract, right eye: Secondary | ICD-10-CM | POA: Diagnosis not present

## 2020-02-13 DIAGNOSIS — H2511 Age-related nuclear cataract, right eye: Secondary | ICD-10-CM | POA: Diagnosis not present

## 2020-03-06 DIAGNOSIS — Z961 Presence of intraocular lens: Secondary | ICD-10-CM | POA: Diagnosis not present

## 2020-04-06 DIAGNOSIS — Z0100 Encounter for examination of eyes and vision without abnormal findings: Secondary | ICD-10-CM | POA: Diagnosis not present

## 2020-05-14 DIAGNOSIS — E039 Hypothyroidism, unspecified: Secondary | ICD-10-CM | POA: Diagnosis not present

## 2020-05-14 DIAGNOSIS — E1165 Type 2 diabetes mellitus with hyperglycemia: Secondary | ICD-10-CM | POA: Diagnosis not present

## 2020-05-14 DIAGNOSIS — R69 Illness, unspecified: Secondary | ICD-10-CM | POA: Diagnosis not present

## 2020-05-14 DIAGNOSIS — K219 Gastro-esophageal reflux disease without esophagitis: Secondary | ICD-10-CM | POA: Diagnosis not present

## 2020-05-14 DIAGNOSIS — E782 Mixed hyperlipidemia: Secondary | ICD-10-CM | POA: Diagnosis not present

## 2020-06-25 DIAGNOSIS — Z1231 Encounter for screening mammogram for malignant neoplasm of breast: Secondary | ICD-10-CM | POA: Diagnosis not present

## 2020-06-29 DIAGNOSIS — Z87820 Personal history of traumatic brain injury: Secondary | ICD-10-CM | POA: Diagnosis not present

## 2020-06-29 DIAGNOSIS — R200 Anesthesia of skin: Secondary | ICD-10-CM | POA: Diagnosis not present

## 2020-06-29 DIAGNOSIS — R2681 Unsteadiness on feet: Secondary | ICD-10-CM | POA: Diagnosis not present

## 2020-06-29 DIAGNOSIS — G43909 Migraine, unspecified, not intractable, without status migrainosus: Secondary | ICD-10-CM | POA: Diagnosis not present

## 2020-07-17 DIAGNOSIS — R270 Ataxia, unspecified: Secondary | ICD-10-CM | POA: Diagnosis not present

## 2020-07-17 DIAGNOSIS — R2681 Unsteadiness on feet: Secondary | ICD-10-CM | POA: Diagnosis not present

## 2020-07-17 DIAGNOSIS — H748X2 Other specified disorders of left middle ear and mastoid: Secondary | ICD-10-CM | POA: Diagnosis not present

## 2020-07-17 DIAGNOSIS — I6782 Cerebral ischemia: Secondary | ICD-10-CM | POA: Diagnosis not present

## 2020-07-30 DIAGNOSIS — H9202 Otalgia, left ear: Secondary | ICD-10-CM | POA: Diagnosis not present

## 2020-07-30 DIAGNOSIS — J0100 Acute maxillary sinusitis, unspecified: Secondary | ICD-10-CM | POA: Diagnosis not present

## 2020-08-07 DIAGNOSIS — G5602 Carpal tunnel syndrome, left upper limb: Secondary | ICD-10-CM | POA: Diagnosis not present

## 2020-09-02 DIAGNOSIS — R69 Illness, unspecified: Secondary | ICD-10-CM | POA: Diagnosis not present

## 2020-09-15 DIAGNOSIS — G5602 Carpal tunnel syndrome, left upper limb: Secondary | ICD-10-CM | POA: Diagnosis not present

## 2020-09-15 DIAGNOSIS — R2681 Unsteadiness on feet: Secondary | ICD-10-CM | POA: Diagnosis not present

## 2020-09-15 DIAGNOSIS — R519 Headache, unspecified: Secondary | ICD-10-CM | POA: Diagnosis not present

## 2020-09-15 DIAGNOSIS — R200 Anesthesia of skin: Secondary | ICD-10-CM | POA: Diagnosis not present

## 2021-01-01 DIAGNOSIS — R69 Illness, unspecified: Secondary | ICD-10-CM | POA: Diagnosis not present

## 2021-01-01 DIAGNOSIS — K219 Gastro-esophageal reflux disease without esophagitis: Secondary | ICD-10-CM | POA: Diagnosis not present

## 2021-01-01 DIAGNOSIS — Z1211 Encounter for screening for malignant neoplasm of colon: Secondary | ICD-10-CM | POA: Diagnosis not present

## 2021-01-01 DIAGNOSIS — E782 Mixed hyperlipidemia: Secondary | ICD-10-CM | POA: Diagnosis not present

## 2021-01-01 DIAGNOSIS — N1831 Chronic kidney disease, stage 3a: Secondary | ICD-10-CM | POA: Diagnosis not present

## 2021-01-01 DIAGNOSIS — Z0000 Encounter for general adult medical examination without abnormal findings: Secondary | ICD-10-CM | POA: Diagnosis not present

## 2021-01-01 DIAGNOSIS — E039 Hypothyroidism, unspecified: Secondary | ICD-10-CM | POA: Diagnosis not present

## 2021-01-01 DIAGNOSIS — E1165 Type 2 diabetes mellitus with hyperglycemia: Secondary | ICD-10-CM | POA: Diagnosis not present

## 2021-01-01 DIAGNOSIS — E538 Deficiency of other specified B group vitamins: Secondary | ICD-10-CM | POA: Diagnosis not present

## 2021-02-26 ENCOUNTER — Ambulatory Visit: Payer: Medicare HMO | Admitting: Sports Medicine

## 2021-02-26 ENCOUNTER — Other Ambulatory Visit: Payer: Self-pay

## 2021-02-26 ENCOUNTER — Encounter: Payer: Self-pay | Admitting: Sports Medicine

## 2021-02-26 DIAGNOSIS — M79672 Pain in left foot: Secondary | ICD-10-CM

## 2021-02-26 DIAGNOSIS — M79671 Pain in right foot: Secondary | ICD-10-CM | POA: Diagnosis not present

## 2021-02-26 DIAGNOSIS — Q828 Other specified congenital malformations of skin: Secondary | ICD-10-CM

## 2021-02-26 NOTE — Progress Notes (Signed)
Subjective: Elaine Hudson is a 80 y.o. female patient who presents to office for evaluation of Right and left foot pain secondary to callus skin. Patient complains of pain at the lesion present present on the balls of both feet.  Patient reports that she was trying to keep them trimmed herself but can no longer do it.  Patient denies any changes in medication or health history except being placed on trazodone and fuloxetine as well as taking vitamin supplements.  Patient Active Problem List   Diagnosis Date Noted  . Hypothyroidism   . Dizziness   . Nausea   . Chronic headaches   . Fainting     Current Outpatient Medications on File Prior to Visit  Medication Sig Dispense Refill  . Ascorbic Acid (VITAMIN C) 100 MG tablet Take by mouth.    . calcium carbonate (CALCIUM 600) 600 MG TABS tablet Take 600 mg by mouth 2 (two) times daily with a meal.    . cholecalciferol (VITAMIN D) 400 units TABS tablet Take 400 Units by mouth daily.    Marland Kitchen FLUoxetine (PROZAC) 40 MG capsule Take 1 capsule by mouth daily.    Marland Kitchen levothyroxine (SYNTHROID, LEVOTHROID) 50 MCG tablet Take 50 mcg by mouth daily before breakfast.    . meloxicam (MOBIC) 15 MG tablet Take 1 tablet by mouth daily.    . Omeprazole 20 MG TBDD Take 1 tablet by mouth daily.     Marland Kitchen zolpidem (AMBIEN) 10 MG tablet Take 10 mg by mouth at bedtime as needed for sleep.     No current facility-administered medications on file prior to visit.    No Known Allergies  Objective:  General: Alert and oriented x3 in no acute distress  Dermatology: Keratotic lesion present submet 2 bilateral with skin lines transversing the lesion, pain is present with direct pressure to the lesion with a central nucleated core noted, no webspace macerations, no ecchymosis bilateral, all nails x 10 are well manicured.   Vascular: Dorsalis Pedis and Posterior Tibial pedal pulses 2/4, Capillary Fill Time 3 seconds, + pedal hair growth bilateral, no edema bilateral lower  extremities, Temperature gradient within normal limits.   Neurology: Johney Maine sensation intact via light touch bilateral.   Musculoskeletal: Mild tenderness with palpation at the keratotic lesion site on Right>Left, Muscular strength 5/5 in all groups without pain or limitation on range of motion. Fat pad atrophy noted bilateral.   Assessment and Plan: Problem List Items Addressed This Visit   None   Visit Diagnoses    Porokeratosis    -  Primary   Foot pain, bilateral          -Complete examination performed -Discussed treatment options -Parred keratoic lesions x2 using a 15 blade; treated the area withSalinocaine covered with Band-Aid -Encouraged daily skin emollients, sample of foot miracle cream provided -Encouraged use of pumice stone -Advised good supportive shoes and inserts -Patient to return to office as needed or sooner if condition worsens.  Landis Martins, DPM

## 2021-03-01 DIAGNOSIS — H6992 Unspecified Eustachian tube disorder, left ear: Secondary | ICD-10-CM | POA: Diagnosis not present

## 2021-03-01 DIAGNOSIS — M545 Low back pain, unspecified: Secondary | ICD-10-CM | POA: Diagnosis not present

## 2021-03-01 DIAGNOSIS — H6121 Impacted cerumen, right ear: Secondary | ICD-10-CM | POA: Diagnosis not present

## 2021-06-29 DIAGNOSIS — Z1231 Encounter for screening mammogram for malignant neoplasm of breast: Secondary | ICD-10-CM | POA: Diagnosis not present

## 2021-07-26 DIAGNOSIS — E039 Hypothyroidism, unspecified: Secondary | ICD-10-CM | POA: Diagnosis not present

## 2021-07-26 DIAGNOSIS — M8580 Other specified disorders of bone density and structure, unspecified site: Secondary | ICD-10-CM | POA: Diagnosis not present

## 2021-07-26 DIAGNOSIS — M810 Age-related osteoporosis without current pathological fracture: Secondary | ICD-10-CM | POA: Diagnosis not present

## 2021-07-26 DIAGNOSIS — G4700 Insomnia, unspecified: Secondary | ICD-10-CM | POA: Diagnosis not present

## 2021-07-26 DIAGNOSIS — R69 Illness, unspecified: Secondary | ICD-10-CM | POA: Diagnosis not present

## 2021-07-26 DIAGNOSIS — K5900 Constipation, unspecified: Secondary | ICD-10-CM | POA: Diagnosis not present

## 2021-07-26 DIAGNOSIS — G8929 Other chronic pain: Secondary | ICD-10-CM | POA: Diagnosis not present

## 2021-07-26 DIAGNOSIS — Z008 Encounter for other general examination: Secondary | ICD-10-CM | POA: Diagnosis not present

## 2021-07-26 DIAGNOSIS — K219 Gastro-esophageal reflux disease without esophagitis: Secondary | ICD-10-CM | POA: Diagnosis not present

## 2021-07-26 DIAGNOSIS — M1990 Unspecified osteoarthritis, unspecified site: Secondary | ICD-10-CM | POA: Diagnosis not present

## 2021-07-26 DIAGNOSIS — I951 Orthostatic hypotension: Secondary | ICD-10-CM | POA: Diagnosis not present

## 2021-08-25 DIAGNOSIS — S0083XA Contusion of other part of head, initial encounter: Secondary | ICD-10-CM | POA: Diagnosis not present

## 2021-08-25 DIAGNOSIS — R519 Headache, unspecified: Secondary | ICD-10-CM | POA: Diagnosis not present

## 2021-08-25 DIAGNOSIS — J342 Deviated nasal septum: Secondary | ICD-10-CM | POA: Diagnosis not present

## 2021-08-25 DIAGNOSIS — S0240FA Zygomatic fracture, left side, initial encounter for closed fracture: Secondary | ICD-10-CM | POA: Diagnosis not present

## 2021-08-25 DIAGNOSIS — Z23 Encounter for immunization: Secondary | ICD-10-CM | POA: Diagnosis not present

## 2021-09-10 DIAGNOSIS — H35373 Puckering of macula, bilateral: Secondary | ICD-10-CM | POA: Diagnosis not present

## 2021-11-11 DIAGNOSIS — H6123 Impacted cerumen, bilateral: Secondary | ICD-10-CM | POA: Diagnosis not present

## 2022-01-10 DIAGNOSIS — N390 Urinary tract infection, site not specified: Secondary | ICD-10-CM | POA: Diagnosis not present

## 2022-01-10 DIAGNOSIS — R319 Hematuria, unspecified: Secondary | ICD-10-CM | POA: Diagnosis not present

## 2022-02-24 DIAGNOSIS — E039 Hypothyroidism, unspecified: Secondary | ICD-10-CM | POA: Diagnosis not present

## 2022-02-24 DIAGNOSIS — M25562 Pain in left knee: Secondary | ICD-10-CM | POA: Diagnosis not present

## 2022-02-24 DIAGNOSIS — Z0000 Encounter for general adult medical examination without abnormal findings: Secondary | ICD-10-CM | POA: Diagnosis not present

## 2022-02-24 DIAGNOSIS — J0100 Acute maxillary sinusitis, unspecified: Secondary | ICD-10-CM | POA: Diagnosis not present

## 2022-02-24 DIAGNOSIS — E782 Mixed hyperlipidemia: Secondary | ICD-10-CM | POA: Diagnosis not present

## 2022-02-24 DIAGNOSIS — E1165 Type 2 diabetes mellitus with hyperglycemia: Secondary | ICD-10-CM | POA: Diagnosis not present

## 2022-02-24 DIAGNOSIS — E1122 Type 2 diabetes mellitus with diabetic chronic kidney disease: Secondary | ICD-10-CM | POA: Diagnosis not present

## 2022-02-24 DIAGNOSIS — N1831 Chronic kidney disease, stage 3a: Secondary | ICD-10-CM | POA: Diagnosis not present

## 2022-02-24 DIAGNOSIS — E1143 Type 2 diabetes mellitus with diabetic autonomic (poly)neuropathy: Secondary | ICD-10-CM | POA: Diagnosis not present

## 2022-02-24 DIAGNOSIS — R300 Dysuria: Secondary | ICD-10-CM | POA: Diagnosis not present

## 2022-02-24 DIAGNOSIS — M542 Cervicalgia: Secondary | ICD-10-CM | POA: Diagnosis not present

## 2022-02-24 DIAGNOSIS — R69 Illness, unspecified: Secondary | ICD-10-CM | POA: Diagnosis not present

## 2022-02-24 DIAGNOSIS — K219 Gastro-esophageal reflux disease without esophagitis: Secondary | ICD-10-CM | POA: Diagnosis not present

## 2022-02-24 DIAGNOSIS — M5432 Sciatica, left side: Secondary | ICD-10-CM | POA: Diagnosis not present

## 2022-04-07 DIAGNOSIS — M545 Low back pain, unspecified: Secondary | ICD-10-CM | POA: Diagnosis not present

## 2022-04-07 DIAGNOSIS — M5442 Lumbago with sciatica, left side: Secondary | ICD-10-CM | POA: Diagnosis not present

## 2022-04-07 DIAGNOSIS — M79605 Pain in left leg: Secondary | ICD-10-CM | POA: Diagnosis not present

## 2022-04-07 DIAGNOSIS — M25562 Pain in left knee: Secondary | ICD-10-CM | POA: Diagnosis not present

## 2022-04-07 DIAGNOSIS — M25552 Pain in left hip: Secondary | ICD-10-CM | POA: Diagnosis not present

## 2022-04-15 DIAGNOSIS — M5432 Sciatica, left side: Secondary | ICD-10-CM | POA: Diagnosis not present

## 2022-04-21 DIAGNOSIS — M25562 Pain in left knee: Secondary | ICD-10-CM | POA: Diagnosis not present

## 2022-04-21 DIAGNOSIS — E1165 Type 2 diabetes mellitus with hyperglycemia: Secondary | ICD-10-CM | POA: Diagnosis not present

## 2022-04-21 DIAGNOSIS — L309 Dermatitis, unspecified: Secondary | ICD-10-CM | POA: Diagnosis not present

## 2022-04-21 DIAGNOSIS — Z0000 Encounter for general adult medical examination without abnormal findings: Secondary | ICD-10-CM | POA: Diagnosis not present

## 2022-04-21 DIAGNOSIS — J0100 Acute maxillary sinusitis, unspecified: Secondary | ICD-10-CM | POA: Diagnosis not present

## 2022-04-21 DIAGNOSIS — N1831 Chronic kidney disease, stage 3a: Secondary | ICD-10-CM | POA: Diagnosis not present

## 2022-04-21 DIAGNOSIS — M542 Cervicalgia: Secondary | ICD-10-CM | POA: Diagnosis not present

## 2022-04-21 DIAGNOSIS — M5432 Sciatica, left side: Secondary | ICD-10-CM | POA: Diagnosis not present

## 2022-04-21 DIAGNOSIS — E039 Hypothyroidism, unspecified: Secondary | ICD-10-CM | POA: Diagnosis not present

## 2022-04-21 DIAGNOSIS — R300 Dysuria: Secondary | ICD-10-CM | POA: Diagnosis not present

## 2022-04-21 DIAGNOSIS — E782 Mixed hyperlipidemia: Secondary | ICD-10-CM | POA: Diagnosis not present

## 2022-04-21 DIAGNOSIS — Z0001 Encounter for general adult medical examination with abnormal findings: Secondary | ICD-10-CM | POA: Diagnosis not present

## 2022-04-21 DIAGNOSIS — K219 Gastro-esophageal reflux disease without esophagitis: Secondary | ICD-10-CM | POA: Diagnosis not present

## 2022-04-21 DIAGNOSIS — Z1329 Encounter for screening for other suspected endocrine disorder: Secondary | ICD-10-CM | POA: Diagnosis not present

## 2022-04-21 DIAGNOSIS — R69 Illness, unspecified: Secondary | ICD-10-CM | POA: Diagnosis not present

## 2022-04-21 DIAGNOSIS — Z79899 Other long term (current) drug therapy: Secondary | ICD-10-CM | POA: Diagnosis not present

## 2022-04-26 DIAGNOSIS — M5442 Lumbago with sciatica, left side: Secondary | ICD-10-CM | POA: Diagnosis not present

## 2022-04-26 DIAGNOSIS — M5459 Other low back pain: Secondary | ICD-10-CM | POA: Diagnosis not present

## 2022-04-28 DIAGNOSIS — M5459 Other low back pain: Secondary | ICD-10-CM | POA: Diagnosis not present

## 2022-04-28 DIAGNOSIS — M5442 Lumbago with sciatica, left side: Secondary | ICD-10-CM | POA: Diagnosis not present

## 2022-05-03 DIAGNOSIS — M5442 Lumbago with sciatica, left side: Secondary | ICD-10-CM | POA: Diagnosis not present

## 2022-05-03 DIAGNOSIS — M5459 Other low back pain: Secondary | ICD-10-CM | POA: Diagnosis not present

## 2022-05-04 DIAGNOSIS — M1990 Unspecified osteoarthritis, unspecified site: Secondary | ICD-10-CM | POA: Diagnosis not present

## 2022-05-04 DIAGNOSIS — E039 Hypothyroidism, unspecified: Secondary | ICD-10-CM | POA: Diagnosis not present

## 2022-05-04 DIAGNOSIS — K5900 Constipation, unspecified: Secondary | ICD-10-CM | POA: Diagnosis not present

## 2022-05-04 DIAGNOSIS — G4700 Insomnia, unspecified: Secondary | ICD-10-CM | POA: Diagnosis not present

## 2022-05-04 DIAGNOSIS — Z9181 History of falling: Secondary | ICD-10-CM | POA: Diagnosis not present

## 2022-05-04 DIAGNOSIS — R69 Illness, unspecified: Secondary | ICD-10-CM | POA: Diagnosis not present

## 2022-05-04 DIAGNOSIS — Z820 Family history of epilepsy and other diseases of the nervous system: Secondary | ICD-10-CM | POA: Diagnosis not present

## 2022-05-04 DIAGNOSIS — K219 Gastro-esophageal reflux disease without esophagitis: Secondary | ICD-10-CM | POA: Diagnosis not present

## 2022-05-04 DIAGNOSIS — Z96649 Presence of unspecified artificial hip joint: Secondary | ICD-10-CM | POA: Diagnosis not present

## 2022-05-05 DIAGNOSIS — M5442 Lumbago with sciatica, left side: Secondary | ICD-10-CM | POA: Diagnosis not present

## 2022-05-05 DIAGNOSIS — M5459 Other low back pain: Secondary | ICD-10-CM | POA: Diagnosis not present

## 2022-06-21 ENCOUNTER — Ambulatory Visit: Payer: Medicare HMO | Admitting: Podiatrist

## 2022-06-21 DIAGNOSIS — M79672 Pain in left foot: Secondary | ICD-10-CM | POA: Diagnosis not present

## 2022-06-21 DIAGNOSIS — L603 Nail dystrophy: Secondary | ICD-10-CM

## 2022-06-21 DIAGNOSIS — Q828 Other specified congenital malformations of skin: Secondary | ICD-10-CM | POA: Diagnosis not present

## 2022-06-21 DIAGNOSIS — M79671 Pain in right foot: Secondary | ICD-10-CM | POA: Diagnosis not present

## 2022-06-21 NOTE — Progress Notes (Signed)
Subjective: Elaine Hudson is a 81 y.o. female patient who presents to office today complaining of long,mildly painful nails  while ambulating in shoes; unable to trim.  She also has a painful lesion on the bottom of her foot that is uncomfortable in shoes and walking- she has had this area trimmed by Dr. Cannon Kettle in the past. Patient denies any new changes in medication or new problems. Patient denies any new cramping, numbness, burning or tingling in the legs or feet.  Patient Active Problem List   Diagnosis Date Noted   Hypothyroidism    Dizziness    Nausea    Chronic headaches    Fainting    Current Outpatient Medications on File Prior to Visit  Medication Sig Dispense Refill   Ascorbic Acid (VITAMIN C) 100 MG tablet Take by mouth.     calcium carbonate (CALCIUM 600) 600 MG TABS tablet Take 600 mg by mouth 2 (two) times daily with a meal.     cholecalciferol (VITAMIN D) 400 units TABS tablet Take 400 Units by mouth daily.     FLUoxetine (PROZAC) 40 MG capsule Take 1 capsule by mouth daily.     levothyroxine (SYNTHROID, LEVOTHROID) 50 MCG tablet Take 50 mcg by mouth daily before breakfast.     meloxicam (MOBIC) 15 MG tablet Take 1 tablet by mouth daily.     Omeprazole 20 MG TBDD Take 1 tablet by mouth daily.      zolpidem (AMBIEN) 10 MG tablet Take 10 mg by mouth at bedtime as needed for sleep.     No current facility-administered medications on file prior to visit.   No Known Allergies   Objective: General: Patient is awake, alert, and oriented x 3 and in no acute distress.  Integument: Skin is warm, dry and supple bilateral. Nails are tender, long, thickened and  dystrophic with subungual debris, consistent with onychomycosis, 1-5 bilateral. No signs of infection. Porokeratotic lesion/ keratotic lesion present submet 2 bilateral.   Vasculature:  Dorsalis Pedis pulse /4 bilateral. Posterior Tibial pulse  /4 bilateral.  Capillary fill time <3 sec 1-5 bilateral. Positive hair  growth to the level of the digits. Temperature gradient within normal limits. No varicosities present bilateral. No edema present bilateral.   Neurology: The patient has intact sensation measured with a 5.07/10g Semmes Weinstein Monofilament at all pedal sites bilateral . Vibratory sensation diminished bilateral with tuning fork. No Babinski sign present bilateral.   Musculoskeletal: No symptomatic pedal deformities noted bilateral. Muscular strength 5/5 in all lower extremity muscular groups bilateral without pain on range of motion . No tenderness with calf compression bilateral.  Assessment and Plan:   ICD-10-CM   1. Porokeratosis  Q82.8     2. Foot pain, bilateral  M79.671    M79.672     3. Nail dystrophy  L60.3        -Examined patient. -Mechanically debrided all nails 1-5 bilateral using sterile nail nipper and filed with dremel without incident  - debrided the porokeratotic lesion with a 15 blade without complication. -Answered all patient questions -Patient to return  in 3 months for continued foot care.  -Patient advised to call the office if any problems or questions arise in the meantime.  Bronson Ing, DPM

## 2022-06-26 ENCOUNTER — Encounter: Payer: Self-pay | Admitting: Podiatrist

## 2022-07-07 DIAGNOSIS — J0100 Acute maxillary sinusitis, unspecified: Secondary | ICD-10-CM | POA: Diagnosis not present

## 2022-07-20 DIAGNOSIS — Z1231 Encounter for screening mammogram for malignant neoplasm of breast: Secondary | ICD-10-CM | POA: Diagnosis not present

## 2022-09-16 DIAGNOSIS — B338 Other specified viral diseases: Secondary | ICD-10-CM | POA: Diagnosis not present

## 2023-02-08 DIAGNOSIS — H1132 Conjunctival hemorrhage, left eye: Secondary | ICD-10-CM | POA: Diagnosis not present

## 2023-03-02 DIAGNOSIS — N1831 Chronic kidney disease, stage 3a: Secondary | ICD-10-CM | POA: Diagnosis not present

## 2023-03-02 DIAGNOSIS — R300 Dysuria: Secondary | ICD-10-CM | POA: Diagnosis not present

## 2023-03-02 DIAGNOSIS — Z1211 Encounter for screening for malignant neoplasm of colon: Secondary | ICD-10-CM | POA: Diagnosis not present

## 2023-03-02 DIAGNOSIS — Z0000 Encounter for general adult medical examination without abnormal findings: Secondary | ICD-10-CM | POA: Diagnosis not present

## 2023-03-02 DIAGNOSIS — R69 Illness, unspecified: Secondary | ICD-10-CM | POA: Diagnosis not present

## 2023-03-02 DIAGNOSIS — E782 Mixed hyperlipidemia: Secondary | ICD-10-CM | POA: Diagnosis not present

## 2023-03-02 DIAGNOSIS — E039 Hypothyroidism, unspecified: Secondary | ICD-10-CM | POA: Diagnosis not present

## 2023-03-02 DIAGNOSIS — E1165 Type 2 diabetes mellitus with hyperglycemia: Secondary | ICD-10-CM | POA: Diagnosis not present

## 2023-03-02 DIAGNOSIS — K219 Gastro-esophageal reflux disease without esophagitis: Secondary | ICD-10-CM | POA: Diagnosis not present

## 2023-03-02 DIAGNOSIS — J3089 Other allergic rhinitis: Secondary | ICD-10-CM | POA: Diagnosis not present

## 2023-03-06 DIAGNOSIS — Z0000 Encounter for general adult medical examination without abnormal findings: Secondary | ICD-10-CM | POA: Diagnosis not present

## 2023-03-23 DIAGNOSIS — Z1211 Encounter for screening for malignant neoplasm of colon: Secondary | ICD-10-CM | POA: Diagnosis not present

## 2023-04-19 DIAGNOSIS — L719 Rosacea, unspecified: Secondary | ICD-10-CM | POA: Diagnosis not present

## 2023-05-31 DIAGNOSIS — J0100 Acute maxillary sinusitis, unspecified: Secondary | ICD-10-CM | POA: Diagnosis not present

## 2023-05-31 DIAGNOSIS — H8113 Benign paroxysmal vertigo, bilateral: Secondary | ICD-10-CM | POA: Diagnosis not present

## 2023-05-31 DIAGNOSIS — M129 Arthropathy, unspecified: Secondary | ICD-10-CM | POA: Diagnosis not present

## 2023-06-19 DIAGNOSIS — E785 Hyperlipidemia, unspecified: Secondary | ICD-10-CM | POA: Diagnosis not present

## 2023-06-19 DIAGNOSIS — J309 Allergic rhinitis, unspecified: Secondary | ICD-10-CM | POA: Diagnosis not present

## 2023-06-19 DIAGNOSIS — M1990 Unspecified osteoarthritis, unspecified site: Secondary | ICD-10-CM | POA: Diagnosis not present

## 2023-06-19 DIAGNOSIS — R2681 Unsteadiness on feet: Secondary | ICD-10-CM | POA: Diagnosis not present

## 2023-06-19 DIAGNOSIS — F325 Major depressive disorder, single episode, in full remission: Secondary | ICD-10-CM | POA: Diagnosis not present

## 2023-06-19 DIAGNOSIS — E039 Hypothyroidism, unspecified: Secondary | ICD-10-CM | POA: Diagnosis not present

## 2023-06-19 DIAGNOSIS — E1122 Type 2 diabetes mellitus with diabetic chronic kidney disease: Secondary | ICD-10-CM | POA: Diagnosis not present

## 2023-06-19 DIAGNOSIS — F419 Anxiety disorder, unspecified: Secondary | ICD-10-CM | POA: Diagnosis not present

## 2023-06-19 DIAGNOSIS — Z008 Encounter for other general examination: Secondary | ICD-10-CM | POA: Diagnosis not present

## 2023-06-19 DIAGNOSIS — M810 Age-related osteoporosis without current pathological fracture: Secondary | ICD-10-CM | POA: Diagnosis not present

## 2023-06-19 DIAGNOSIS — M5430 Sciatica, unspecified side: Secondary | ICD-10-CM | POA: Diagnosis not present

## 2023-06-19 DIAGNOSIS — K219 Gastro-esophageal reflux disease without esophagitis: Secondary | ICD-10-CM | POA: Diagnosis not present

## 2023-06-19 DIAGNOSIS — E1140 Type 2 diabetes mellitus with diabetic neuropathy, unspecified: Secondary | ICD-10-CM | POA: Diagnosis not present

## 2023-07-11 DIAGNOSIS — E861 Hypovolemia: Secondary | ICD-10-CM | POA: Diagnosis not present

## 2023-07-11 DIAGNOSIS — R5383 Other fatigue: Secondary | ICD-10-CM | POA: Diagnosis not present

## 2023-09-05 DIAGNOSIS — Z961 Presence of intraocular lens: Secondary | ICD-10-CM | POA: Diagnosis not present

## 2023-09-05 DIAGNOSIS — H35373 Puckering of macula, bilateral: Secondary | ICD-10-CM | POA: Diagnosis not present

## 2023-09-13 DIAGNOSIS — Z1231 Encounter for screening mammogram for malignant neoplasm of breast: Secondary | ICD-10-CM | POA: Diagnosis not present
